# Patient Record
Sex: Male | Born: 1955 | Race: Black or African American | Hispanic: No | Marital: Single | Smoking: Current every day smoker
Health system: Southern US, Community
[De-identification: ages and names within clinical notes are randomized; demographics above are authoritative.]

## PROBLEM LIST (undated history)

## (undated) HISTORY — PX: SURGERY SCROTAL / TESTICULAR: SUR1316

---

## 2005-09-05 ENCOUNTER — Emergency Department: Payer: Self-pay | Admitting: Emergency Medicine

## 2008-08-20 ENCOUNTER — Emergency Department: Payer: Self-pay | Admitting: Emergency Medicine

## 2011-10-16 ENCOUNTER — Emergency Department: Payer: Self-pay | Admitting: Emergency Medicine

## 2011-10-16 LAB — CBC
HCT: 42.6 % (ref 40.0–52.0)
HGB: 14.1 g/dL (ref 13.0–18.0)
MCH: 28.5 pg (ref 26.0–34.0)
MCHC: 33 g/dL (ref 32.0–36.0)
RBC: 4.93 10*6/uL (ref 4.40–5.90)
WBC: 13.6 10*3/uL — ABNORMAL HIGH (ref 3.8–10.6)

## 2011-10-16 LAB — COMPREHENSIVE METABOLIC PANEL
Anion Gap: 12 (ref 7–16)
Bilirubin,Total: 0.7 mg/dL (ref 0.2–1.0)
Chloride: 96 mmol/L — ABNORMAL LOW (ref 98–107)
Co2: 30 mmol/L (ref 21–32)
Creatinine: 1.4 mg/dL — ABNORMAL HIGH (ref 0.60–1.30)
EGFR (African American): >60
EGFR (Non-African Amer.): 56 — ABNORMAL LOW
Glucose: 116 mg/dL — ABNORMAL HIGH (ref 65–99)
Osmolality: 278 (ref 275–301)
SGPT (ALT): 17 U/L
Sodium: 138 mmol/L (ref 136–145)
Total Protein: 8.6 g/dL — ABNORMAL HIGH (ref 6.4–8.2)

## 2011-10-16 LAB — RAPID INFLUENZA A&B ANTIGENS

## 2013-03-17 ENCOUNTER — Emergency Department: Payer: Self-pay | Admitting: Emergency Medicine

## 2013-12-19 ENCOUNTER — Emergency Department: Payer: Self-pay | Admitting: Emergency Medicine

## 2016-09-18 ENCOUNTER — Encounter: Payer: Self-pay | Admitting: Emergency Medicine

## 2016-09-18 ENCOUNTER — Emergency Department
Admission: EM | Admit: 2016-09-18 | Discharge: 2016-09-18 | Disposition: A | Discharge status: Home / Self Care | Payer: BLUE CROSS/BLUE SHIELD | Attending: Emergency Medicine | Admitting: Emergency Medicine

## 2016-09-18 ENCOUNTER — Emergency Department: Payer: BLUE CROSS/BLUE SHIELD

## 2016-09-18 DIAGNOSIS — F17210 Nicotine dependence, cigarettes, uncomplicated: Secondary | ICD-10-CM | POA: Diagnosis not present

## 2016-09-18 DIAGNOSIS — J189 Pneumonia, unspecified organism: Secondary | ICD-10-CM | POA: Insufficient documentation

## 2016-09-18 DIAGNOSIS — R05 Cough: Secondary | ICD-10-CM | POA: Diagnosis present

## 2016-09-18 MED ORDER — PREDNISONE 50 MG PO TABS: 50.0000 mg | ORAL_TABLET | Freq: Every day | ORAL | 0 refills | Status: AC

## 2016-09-18 MED ORDER — ALBUTEROL SULFATE HFA 108 (90 BASE) MCG/ACT IN AERS: 2.0000 | INHALATION_SPRAY | RESPIRATORY_TRACT | 0 refills | Status: AC | PRN

## 2016-09-18 MED ORDER — METHYLPREDNISOLONE SODIUM SUCC 125 MG IJ SOLR
125.0000 mg | Freq: Once | INTRAMUSCULAR | Status: AC
  Administered 2016-09-18: 125 mg via INTRAMUSCULAR
  Filled 2016-09-18: qty 2

## 2016-09-18 MED ORDER — AZITHROMYCIN 250 MG PO TABS: ORAL_TABLET | ORAL | 0 refills | Status: AC

## 2016-09-18 MED ORDER — PSEUDOEPH-BROMPHEN-DM 30-2-10 MG/5ML PO SYRP: 10.0000 mL | ORAL_SOLUTION | Freq: Four times a day (QID) | ORAL | 0 refills | Status: AC | PRN

## 2016-09-18 MED ORDER — ALBUTEROL SULFATE (2.5 MG/3ML) 0.083% IN NEBU
2.5000 mg | INHALATION_SOLUTION | Freq: Once | RESPIRATORY_TRACT | Status: AC
  Administered 2016-09-18: 2.5 mg via RESPIRATORY_TRACT
  Filled 2016-09-18: qty 3

## 2016-09-18 NOTE — ED Triage Notes (Signed)
Patient ambulatory to triage with steady gait, without difficulty or distress noted, mask in place; pt st prod cough green sputum x 2wks ago with fever and congestion; taking OTC meds without relief

## 2016-09-18 NOTE — ED Provider Notes (Signed)
[facility] Emergency Department Provider Note  ____________________________________________  Time seen: Approximately 10:18 PM  I have reviewed the triage vital signs and the nursing notes.   HISTORY  Chief Complaint Cough    HPI Kevin Villanueva is a 60 y.o. male who presents emergency department complaining of two-week history of coughing. 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 He reports that with the changing whether he got a "cold". Patient reports that initially had some nasal congestion, ear fullness, scratchy throat. He then developed a cough. Patient reports that the low-grade fever, nasal congestion, sore throat have resolved but he remains with the cough. Patient states that it is intermittently productive but has coughing spells that leaves him short of breath. Patient denies any actual shortness of breath. He denies any chest pain, abdominal pain, nausea or vomiting. Patient is tried multiple over-the-counter cold and flu preparations with no relief.   History reviewed. No pertinent past medical history.  There are no active problems to display for this patient.   Past Surgical History:  Procedure Laterality Date  . SURGERY SCROTAL / TESTICULAR      Prior to Admission medications   Medication Sig Start Date End Date Taking? Authorizing Provider  albuterol (PROVENTIL HFA;VENTOLIN HFA) 108 (90 Base) MCG/ACT inhaler Inhale 2 puffs into the lungs every 4 (four) hours as needed for wheezing or shortness of breath. 09/18/16   Charline Bills Efrain Clauson, PA-C  azithromycin (ZITHROMAX Z-PAK) 250 MG tablet Take 2 tablets (500 mg) on  Day 1,  followed by 1 tablet (250 mg) once daily on Days 2 through 5. 09/18/16   Roderic Palau D Cathi Hazan, PA-C  brompheniramine-pseudoephedrine-DM 30-2-10 MG/5ML syrup Take 10 mLs by mouth 4 (four) times daily as needed. 09/18/16   Charline Bills Cashlynn Yearwood, PA-C  predniSONE (DELTASONE) 50 MG tablet Take 1 tablet (50 mg  total) by mouth daily with breakfast. 09/18/16   Charline Bills May Manrique, PA-C    Allergies Patient has no known allergies.  No family history on file.  Social History Social History  Substance Use Topics  . Smoking status: Current Every Day Smoker    Packs/day: 1.00    Types: Cigarettes  . Smokeless tobacco: Never Used  . Alcohol use Yes     Comment: "occasional"     Review of Systems  Constitutional: No fever/chills Eyes: No visual changes.  ENT: Positive for resolved nasal congestion and sore throat. Cardiovascular: no chest pain. Respiratory: Positive for mainly dry but intermittently productive cough. No SOB. Gastrointestinal: No abdominal pain.  No nausea, no vomiting.  No diarrhea.  No constipation. Musculoskeletal: Negative for musculoskeletal pain. Skin: Negative for rash, abrasions, lacerations, ecchymosis. Neurological: Negative for headaches, focal weakness or numbness. 10-point ROS otherwise negative.  ____________________________________________   PHYSICAL EXAM:  VITAL SIGNS: ED Triage Vitals  Enc Vitals Group     BP 09/18/16 2003 (!) 159/85     Pulse Rate 09/18/16 2003 68     Resp 09/18/16 2003 20     Temp 09/18/16 2003 98.6 F (37 C)     Temp Source 09/18/16 2003 Oral     SpO2 09/18/16 2003 97 %     Weight 09/18/16 2001 153 lb (69.4 kg)     Height 09/18/16 2001 5\' 7"  (1.702 m)     Head Circumference --      Peak Flow --      Pain Score --      Pain Loc --  Pain Edu? --      Excl. in [place]? --      Constitutional: Alert and oriented. Well appearing and in no acute distress. Eyes: Conjunctivae are normal. PERRL. EOMI. Head: Atraumatic. ENT:      Ears: EACs and TMs are unremarkable bilaterally.      Nose: No congestion/rhinnorhea.      Mouth/Throat: Mucous membranes are moist. Her pharynx is nonerythematous and nonedematous. Neck: No stridor.   Hematological/Lymphatic/Immunilogical: No cervical lymphadenopathy. Cardiovascular: Normal rate,  regular rhythm. Normal S1 and S2.  Good peripheral circulation. Respiratory: Normal respiratory effort without tachypnea or retractions. Lungs with scattered expiratory wheezing bilateral lung fields. No rales or rhonchi.Kermit Balo air entry to the bases with no decreased or absent breath sounds. Musculoskeletal: Full range of motion to all extremities. No gross deformities appreciated. Neurologic:  Normal speech and language. No gross focal neurologic deficits are appreciated.  Skin:  Skin is warm, dry and intact. No rash noted. Psychiatric: Mood and affect are normal. Speech and behavior are normal. Patient exhibits appropriate insight and judgement.   ____________________________________________   LABS (all labs ordered are listed, but only abnormal results are displayed)  Labs Reviewed - No data to display ____________________________________________  EKG   ____________________________________________  RADIOLOGY Diamantina Providence Starling Jessie, personally viewed and evaluated these images (plain radiographs) as part of my medical decision making, as well as reviewing the written report by the radiologist.  Dg Chest 2 View  Result Date: 09/18/2016 CLINICAL DATA:  Productive cough 2 weeks ago with fever. No relief from over the counter medications. EXAM: CHEST  2 VIEW COMPARISON:  None. FINDINGS: The heart size and mediastinal contours are within normal limits. Both lungs are clear. The visualized skeletal structures are unremarkable. IMPRESSION: No active cardiopulmonary disease. Electronically Signed   By: Andreas Newport M.D.   On: 09/18/2016 21:39    ____________________________________________    PROCEDURES  Procedure(s) performed:    Procedures    Medications  albuterol (PROVENTIL) (2.5 MG/3ML) 0.083% nebulizer solution 2.5 mg (2.5 mg Nebulization Given 09/18/16 2236)  methylPREDNISolone sodium succinate (SOLU-MEDROL) 125 mg/2 mL injection 125 mg (125 mg Intramuscular Given  09/18/16 2233)     ____________________________________________   INITIAL IMPRESSION / ASSESSMENT AND PLAN / ED COURSE  Pertinent labs & imaging results that were available during my care of the patient were reviewed by me and considered in my medical decision making (see chart for details).  Review of the [place] CSRS was performed in accordance of the [place] prior to dispensing any controlled drugs.  Clinical Course     Patient's diagnosis is consistent with Mild community-acquired pneumonia. Patient has had ongoing coughing symptoms have been increasing over the past 2 weeks. Patient's symptoms likely began with a viral upper respiratory illness that it progressed to a bacterial infection. X-ray is reassuring with no acute consolidation. Exam does reveal scattered wheezing for which patient is treated with albuterol and injection of steroids to the emergency department. After albuterol, patient's exam has improved.. Patient will be discharged home with prescriptions for antibiotics, cough syrup, prednisone, albuterol. Patient is to follow up with primary care as needed or otherwise directed. Patient is given ED precautions to return to the ED for any worsening or new symptoms.     ____________________________________________  FINAL CLINICAL IMPRESSION(S) / ED DIAGNOSES  Final diagnoses:  Community acquired pneumonia, unspecified laterality      NEW MEDICATIONS STARTED DURING THIS VISIT:  New Prescriptions   ALBUTEROL (PROVENTIL HFA;VENTOLIN HFA) 108 (  90 BASE) MCG/ACT INHALER    Inhale 2 puffs into the lungs every 4 (four) hours as needed for wheezing or shortness of breath.   AZITHROMYCIN (ZITHROMAX Z-PAK) 250 MG TABLET    Take 2 tablets (500 mg) on  Day 1,  followed by 1 tablet (250 mg) once daily on Days 2 through 5.   BROMPHENIRAMINE-PSEUDOEPHEDRINE-DM 30-2-10 MG/5ML SYRUP    Take 10 mLs by mouth 4 (four) times daily as needed.   PREDNISONE (DELTASONE) 50 MG TABLET    Take 1  tablet (50 mg total) by mouth daily with breakfast.        This chart was dictated using voice recognition software/Dragon. Despite best efforts to proofread, errors can occur which can change the meaning. Any change was purely unintentional.    Darletta Moll, PA-C 09/18/16 2252    Carrie Mew, MD 09/19/16 2243

## 2016-09-27 ENCOUNTER — Encounter: Payer: Self-pay | Admitting: Emergency Medicine

## 2016-09-27 ENCOUNTER — Emergency Department
Admission: EM | Admit: 2016-09-27 | Discharge: 2016-09-27 | Disposition: A | Discharge status: Home / Self Care | Payer: BLUE CROSS/BLUE SHIELD | Attending: Emergency Medicine | Admitting: Emergency Medicine

## 2016-09-27 DIAGNOSIS — Z79899 Other long term (current) drug therapy: Secondary | ICD-10-CM | POA: Diagnosis not present

## 2016-09-27 DIAGNOSIS — R059 Cough, unspecified: Secondary | ICD-10-CM

## 2016-09-27 DIAGNOSIS — R05 Cough: Secondary | ICD-10-CM | POA: Diagnosis present

## 2016-09-27 DIAGNOSIS — F17210 Nicotine dependence, cigarettes, uncomplicated: Secondary | ICD-10-CM | POA: Insufficient documentation

## 2016-09-27 NOTE — ED Triage Notes (Signed)
Pt to ed with c/o cough, congestion.  States was seen here for same on 12/18.  States he took all meds and is ready to go back to work but wants to be seen before going back.

## 2016-09-27 NOTE — ED Notes (Signed)
Pt states was seen here and given prescriptions, states coughing still going on. Feels like medications have been working. States feels weak, pt talking very fast. Still has some of medication left to take. Pt states inhaler is working for his wheezing. Still has cough. Pt talking about random things, asking questions. Pt alert and oriented, speaking in complete sentences. Ambulatory to room.    Pt tried to go to [facility] but was unable to, wants to be cleared to go back to work "since they told me to come back."

## 2016-09-27 NOTE — ED Provider Notes (Signed)
[facility] Emergency Department Provider Note  ____________________________________________  Time seen: Approximately 6:36 PM  I have reviewed the triage vital signs and the nursing notes.   HISTORY  Chief Complaint Cough    HPI Kevin Villanueva is a 60 y.o. male presenting to the emergency department for reassurance. Patient was seen on 09/18/2016 and was diagnosed with community-acquired pneumonia. Patient was discharged with azithromycin. Patient states that he has taken azithromycin as prescribed. Patient states that non-productive cough has improved and his energy is slowly returning. Patient states that he has occasional chills at night. He has been a unable to assess for fever as he doesn't have a thermometer. Patient states that he feels like he needs a few more days off from work. He was going to follow up with his primary care provider. However, he cannot locate his driver's license. No wheezing or shortness of breath. No chest tightness or chest pain.   History reviewed. No pertinent past medical history.  There are no active problems to display for this patient.   Past Surgical History:  Procedure Laterality Date  . SURGERY SCROTAL / TESTICULAR      Prior to Admission medications   Medication Sig Start Date End Date Taking? Authorizing Provider  albuterol (PROVENTIL HFA;VENTOLIN HFA) 108 (90 Base) MCG/ACT inhaler Inhale 2 puffs into the lungs every 4 (four) hours as needed for wheezing or shortness of breath. 09/18/16   Charline Bills Cuthriell, PA-C  azithromycin (ZITHROMAX Z-PAK) 250 MG tablet Take 2 tablets (500 mg) on  Day 1,  followed by 1 tablet (250 mg) once daily on Days 2 through 5. 09/18/16   Roderic Palau D Cuthriell, PA-C  brompheniramine-pseudoephedrine-DM 30-2-10 MG/5ML syrup Take 10 mLs by mouth 4 (four) times daily as needed. 09/18/16   Charline Bills Cuthriell, PA-C  predniSONE (DELTASONE) 50 MG tablet Take 1 tablet (50 mg total) by mouth daily  with breakfast. 09/18/16   Charline Bills Cuthriell, PA-C    Allergies Patient has no known allergies.  No family history on file.  Social History Social History  Substance Use Topics  . Smoking status: Current Every Day Smoker    Packs/day: 1.00    Types: Cigarettes  . Smokeless tobacco: Never Used  . Alcohol use Yes     Comment: "occasional"     Review of Systems  Constitutional: Improving energy. Eyes: No visual changes. No discharge ENT: No upper respiratory complaints. Cardiovascular: no chest pain. Respiratory: Improving cough.  No SOB. Gastrointestinal: No abdominal pain.  No nausea, no vomiting.  No diarrhea.  No constipation. Musculoskeletal: Negative for musculoskeletal pain. Skin: Negative for rash, abrasions, lacerations, ecchymosis. Neurological: Negative for headaches, focal weakness or numbness. 10-point ROS otherwise negative.  ____________________________________________   PHYSICAL EXAM:  VITAL SIGNS: ED Triage Vitals  Enc Vitals Group     BP 09/27/16 1754 (!) 145/78     Pulse Rate 09/27/16 1754 85     Resp 09/27/16 1754 20     Temp 09/27/16 1754 (!) 89.2 F (31.8 C)     Temp Source 09/27/16 1754 Oral     SpO2 09/27/16 1754 100 %     Weight 09/27/16 1755 153 lb (69.4 kg)     Height --      Head Circumference --      Peak Flow --      Pain Score 09/27/16 1755 0     Pain Loc --      Pain Edu? --  Excl. in [place]? --      Constitutional: Alert and oriented. Well appearing and in no acute distress. Eyes: Conjunctivae are normal. PERRL. EOMI. Head: Atraumatic. ENT:      Ears: Tympanic membranes are visualized bilaterally without erythema or purulent exudate.      Nose: No congestion/rhinnorhea.      Mouth/Throat: Mucous membranes are moist.  Neck: FROM.  Hematological/Lymphatic/Immunilogical: No cervical lymphadenopathy. Cardiovascular: Normal rate, regular rhythm. Normal S1 and S2.  Good peripheral circulation. Respiratory: Normal  respiratory effort without tachypnea or retractions. Lungs CTAB. Good air entry to the bases with no decreased or absent breath sounds. Neurologic:  Normal speech and language. No gross focal neurologic deficits are appreciated.  Skin:  Skin is warm, dry and intact. No rash noted. Psychiatric: Mood and affect are normal. Speech and behavior are normal. Patient exhibits appropriate insight and judgement.  ____________________________________________   LABS (all labs ordered are listed, but only abnormal results are displayed)  Labs Reviewed - No data to display ____________________________________________  EKG   ____________________________________________  RADIOLOGY  No results found.  ____________________________________________    PROCEDURES  Procedure(s) performed:    Procedures    Medications - No data to display   ____________________________________________   INITIAL IMPRESSION / ASSESSMENT AND PLAN / ED COURSE  Pertinent labs & imaging results that were available during my care of the patient were reviewed by me and considered in my medical decision making (see chart for details).  Review of the [place] CSRS was performed in accordance of the [place] prior to dispensing any controlled drugs.  Clinical Course     Assessment and Plan: Patient presents to the emergency department for reassurance. He was seen 09/18/2016 and diagnosed with community-acquired pneumonia. Patient states that he has adhered to pharmacologic regimen. Patient claims improving energy and nonproductive cough. I do not feel that an additional workup is warranted at this time. Patient requests to be written out of work for 3 more days. Vital signs are reassuring at this time. All patient questions were answered.  ____________________________________________  FINAL CLINICAL IMPRESSION(S) / ED DIAGNOSES  Final diagnoses:  Cough      NEW MEDICATIONS STARTED DURING THIS VISIT:  Discharge  Medication List as of 09/27/2016  6:30 PM          This chart was dictated using voice recognition software/Dragon. Despite best efforts to proofread, errors can occur which can change the meaning. Any change was purely unintentional.    Lannie Fields, PA-C 09/27/16 1946    Hinda Kehr, MD 09/28/16 0000

## 2020-09-27 ENCOUNTER — Emergency Department: Payer: Self-pay

## 2020-09-27 ENCOUNTER — Other Ambulatory Visit: Payer: Self-pay

## 2020-09-27 ENCOUNTER — Emergency Department
Admission: EM | Admit: 2020-09-27 | Discharge: 2020-09-27 | Disposition: A | Discharge status: Home / Self Care | Payer: Self-pay | Attending: Emergency Medicine | Admitting: Emergency Medicine

## 2020-09-27 DIAGNOSIS — R1032 Left lower quadrant pain: Secondary | ICD-10-CM

## 2020-09-27 DIAGNOSIS — F17210 Nicotine dependence, cigarettes, uncomplicated: Secondary | ICD-10-CM | POA: Insufficient documentation

## 2020-09-27 DIAGNOSIS — R1030 Lower abdominal pain, unspecified: Secondary | ICD-10-CM

## 2020-09-27 DIAGNOSIS — R112 Nausea with vomiting, unspecified: Secondary | ICD-10-CM | POA: Insufficient documentation

## 2020-09-27 DIAGNOSIS — K4090 Unilateral inguinal hernia, without obstruction or gangrene, not specified as recurrent: Secondary | ICD-10-CM | POA: Insufficient documentation

## 2020-09-27 LAB — URINALYSIS, COMPLETE (UACMP) WITH MICROSCOPIC
Bacteria, UA: NONE SEEN
Bilirubin Urine: NEGATIVE
Glucose, UA: NEGATIVE mg/dL
Hgb urine dipstick: NEGATIVE
Ketones, ur: NEGATIVE mg/dL
Nitrite: NEGATIVE
Protein, ur: NEGATIVE mg/dL
Specific Gravity, Urine: 1.021 (ref 1.005–1.030)
pH: 5 (ref 5.0–8.0)

## 2020-09-27 LAB — CBC
HCT: 46 % (ref 39.0–52.0)
Hemoglobin: 14.9 g/dL (ref 13.0–17.0)
MCH: 29.2 pg (ref 26.0–34.0)
MCHC: 32.4 g/dL (ref 30.0–36.0)
MCV: 90 fL (ref 80.0–100.0)
Platelets: 196 10*3/uL (ref 150–400)
RBC: 5.11 MIL/uL (ref 4.22–5.81)
RDW: 13.2 % (ref 11.5–15.5)
WBC: 6.4 10*3/uL (ref 4.0–10.5)
nRBC: 0 % (ref 0.0–0.2)

## 2020-09-27 LAB — BASIC METABOLIC PANEL
Anion gap: 8 (ref 5–15)
BUN: 19 mg/dL (ref 8–23)
CO2: 25 mmol/L (ref 22–32)
Calcium: 9.6 mg/dL (ref 8.9–10.3)
Chloride: 109 mmol/L (ref 98–111)
Creatinine, Ser: 1.03 mg/dL (ref 0.61–1.24)
GFR, Estimated: >60 mL/min (ref >60)
Glucose, Bld: 134 mg/dL — ABNORMAL HIGH (ref 70–99)
Potassium: 3.6 mmol/L (ref 3.5–5.1)
Sodium: 142 mmol/L (ref 135–145)

## 2020-09-27 MED ORDER — KETOROLAC TROMETHAMINE 30 MG/ML IJ SOLN
15.0000 mg | Freq: Once | INTRAMUSCULAR | Status: AC
  Administered 2020-09-27: 23:00:00 15 mg via INTRAVENOUS
  Filled 2020-09-27: qty 1

## 2020-09-27 MED ORDER — IOHEXOL 300 MG/ML  SOLN
100.0000 mL | Freq: Once | INTRAMUSCULAR | Status: AC | PRN
  Administered 2020-09-27: 23:00:00 100 mL via INTRAVENOUS

## 2020-09-27 NOTE — ED Provider Notes (Signed)
[facility] Emergency Department Provider Note ____________________________________________   Event Date/Time   First MD Initiated Contact with Patient 09/27/20 2045     (approximate)  I have reviewed the triage vital signs and the nursing notes.  HISTORY  Chief Complaint Groin Pain   HPI Kevin Villanueva is a 64 y.o. malewho presents to the ED for evaluation of left inguinal pain.   Chart review indicates no other medical history.  Patient presents to the ED with 3 days of intermittent left inguinal/groin pain.  He reports a remote history of testicular torsion.   Patient reports associated nausea without vomiting, denies stool changes, dysuria, hematuria, flank pain, emesis.  Currently reporting 3/10 intensity aching pain in his left groin that intermittently radiates down his left anterior leg.  Denies trauma or skin changes.   History reviewed. No pertinent past medical history.  There are no problems to display for this patient.   Past Surgical History:  Procedure Laterality Date  . SURGERY SCROTAL / TESTICULAR      Prior to Admission medications   Medication Sig Start Date End Date Taking? Authorizing Provider  albuterol (PROVENTIL HFA;VENTOLIN HFA) 108 (90 Base) MCG/ACT inhaler Inhale 2 puffs into the lungs every 4 (four) hours as needed for wheezing or shortness of breath. 09/18/16   Cuthriell, Delorise Royals, PA-C  azithromycin (ZITHROMAX Z-PAK) 250 MG tablet Take 2 tablets (500 mg) on  Day 1,  followed by 1 tablet (250 mg) once daily on Days 2 through 5. 09/18/16   Cuthriell, Delorise Royals, PA-C  brompheniramine-pseudoephedrine-DM 30-2-10 MG/5ML syrup Take 10 mLs by mouth 4 (four) times daily as needed. 09/18/16   Cuthriell, Delorise Royals, PA-C  predniSONE (DELTASONE) 50 MG tablet Take 1 tablet (50 mg total) by mouth daily with breakfast. 09/18/16   Cuthriell, Delorise Royals, PA-C    Allergies Patient has no known allergies.  No family history on  file.  Social History Social History   Tobacco Use  . Smoking status: Current Every Day Smoker    Packs/day: 1.00    Types: Cigarettes  . Smokeless tobacco: Never Used  Substance Use Topics  . Alcohol use: Yes    Comment: "occasional"    Review of Systems  Constitutional: No fever/chills Eyes: No visual changes. ENT: No sore throat. Cardiovascular: Denies chest pain. Respiratory: Denies shortness of breath. Gastrointestinal, no vomiting.  No diarrhea.  No constipation. Positive for left inguinal pain and nausea. Genitourinary: Negative for dysuria. Musculoskeletal: Negative for back pain. Skin: Negative for rash. Neurological: Negative for headaches, focal weakness or numbness.  ____________________________________________   PHYSICAL EXAM:  VITAL SIGNS: Vitals:   09/27/20 1743 09/27/20 2133  BP:  (!) 154/87  Pulse:  66  Resp:  16  Temp: 98.7 F (37.1 C) 98.5 F (36.9 C)  SpO2:  98%     Constitutional: Alert and oriented. Well appearing and in no acute distress. Eyes: Conjunctivae are normal. PERRL. EOMI. Head: Atraumatic. Nose: No congestion/rhinnorhea. Mouth/Throat: Mucous membranes are moist.  Oropharynx non-erythematous. Neck: No stridor. No cervical spine tenderness to palpation. Cardiovascular: Normal rate, regular rhythm. Grossly normal heart sounds.  Good peripheral circulation. Respiratory: Normal respiratory effort.  No retractions. Lungs CTAB. Gastrointestinal: Soft , nondistended, nontender to palpation. No CVA tenderness.  Benign abdomen. Left inguinal vague tenderness to palpation that is mild.  No palpable masses, or lymphadenopathy appreciated. Normal-appearing external genitalia without swelling or signs of trauma.  Nontender testicles. Musculoskeletal: No lower extremity tenderness nor edema.  No joint  effusions. No signs of acute trauma. Neurologic:  Normal speech and language. No gross focal neurologic deficits are appreciated. No gait  instability noted. Skin:  Skin is warm, dry and intact. No rash noted. Psychiatric: Mood and affect are normal. Speech and behavior are normal.  ____________________________________________   LABS (all labs ordered are listed, but only abnormal results are displayed)  Labs Reviewed  BASIC METABOLIC PANEL - Abnormal; Notable for the following components:      Result Value   Glucose, Bld 134 (*)    All other components within normal limits  URINALYSIS, COMPLETE (UACMP) WITH MICROSCOPIC - Abnormal; Notable for the following components:   Color, Urine YELLOW (*)    APPearance HAZY (*)    Leukocytes,Ua MODERATE (*)    All other components within normal limits  CBC   ____________________________________________  RADIOLOGY  ED MD interpretation: CT abdomen/pelvis reviewed by me without evidence of acute intra-abdominal pathology.  Official radiology report(s): CT ABDOMEN PELVIS W CONTRAST  Result Date: 09/27/2020 CLINICAL DATA:  Left lower quadrant abdominal pain EXAM: CT ABDOMEN AND PELVIS WITH CONTRAST TECHNIQUE: Multidetector CT imaging of the abdomen and pelvis was performed using the standard protocol following bolus administration of intravenous contrast. CONTRAST:  148mL OMNIPAQUE IOHEXOL 300 MG/ML  SOLN COMPARISON:  None. FINDINGS: Lower chest: The visualized heart size within normal limits. No pericardial fluid/thickening. No hiatal hernia. The visualized portions of the lungs are clear. Hepatobiliary: The liver is normal in density without focal abnormality.The main portal vein is patent. No evidence of calcified gallstones, gallbladder wall thickening or biliary dilatation. Pancreas: Unremarkable. No pancreatic ductal dilatation or surrounding inflammatory changes. Spleen: Normal in size without focal abnormality. Adrenals/Urinary Tract: Both adrenal glands appear normal. The kidneys and collecting system appear normal without evidence of urinary tract calculus or hydronephrosis.  Bladder is unremarkable. Stomach/Bowel: The stomach and small bowel are normal in appearance. Scattered colonic diverticula are noted without diverticulitis Vascular/Lymphatic: There are no enlarged mesenteric, retroperitoneal, or pelvic lymph nodes. No significant vascular findings are present. Reproductive: The prostate is unremarkable. Other: No evidence of abdominal wall mass or hernia. Musculoskeletal: No acute or significant osseous findings. IMPRESSION: Colonic diverticulosis without evidence of colonic diverticulitis No left inguinal hernia as seen on the prior ultrasound. Electronically Signed   By: Prudencio Pair M.D.   On: 09/27/2020 22:50   US SCROTUM W/DOPPLER  Result Date: 09/27/2020 CLINICAL DATA:  LEFT-sided scrotal/groin pain. History of testicular torsion with surgical repair. EXAM: SCROTAL ULTRASOUND DOPPLER ULTRASOUND OF THE TESTICLES TECHNIQUE: Complete ultrasound examination of the testicles, epididymis, and other scrotal structures was performed. Color and spectral Doppler ultrasound were also utilized to evaluate blood flow to the testicles. COMPARISON:  None. FINDINGS: Right testicle Measurements: 3.8 x 2.1 x 3 cm. No mass or microlithiasis visualized. Left testicle Measurements: 2.1 x 0.8 x 1.8 cm. No mass visualized. Scattered parenchymal calcifications. Right epididymis:  Normal in size and appearance. Left epididymis: Essentially normal in size and appearance. Small epididymal cyst. Hydrocele:  None visualized. Varicocele:  None visualized. Pulsed Doppler interrogation of both testes demonstrates normal low resistance arterial and venous waveforms bilaterally. At the site of pain, LEFT groin region, hyperechoic masslike structure is seen within the LEFT inguinal canal measuring 3.3 x 0.8 cm, avascular. IMPRESSION: 1. Hyperechoic masslike structure within the LEFT inguinal canal, measuring 3.3 x 0.8 cm, avascular, corresponding to the site of pain as directed by the patient. This is  suspected to represent herniation of mesenteric fat into the LEFT inguinal  canal. No peristalsing bowel loops are appreciated in the inguinal canal. Consider CT abdomen and pelvis to confirm. 2. No evidence of testicular torsion or orchitis. 3. Asymmetrically diminutive LEFT testicle, presumably related to the given history of previous torsion and surgical repair. 4. No evidence of epididymitis. 5. No hydrocele. Electronically Signed   By: Franki Cabot M.D.   On: 09/27/2020 18:52    ____________________________________________   PROCEDURES and INTERVENTIONS  Procedure(s) performed (including Critical Care):  .1-3 Lead EKG Interpretation Performed by: Vladimir Crofts, MD Authorized by: Vladimir Crofts, MD     Interpretation: normal     ECG rate:  68   ECG rate assessment: normal     Rhythm: sinus rhythm     Ectopy: none     Conduction: normal      Medications  ketorolac (TORADOL) 30 MG/ML injection 15 mg (has no administration in time range)  iohexol (OMNIPAQUE) 300 MG/ML solution 100 mL (100 mLs Intravenous Contrast Given 09/27/20 2232)    ____________________________________________   MDM / ED COURSE   64 year old male presents to the ED with intermittent left inguinal pain, likely due to intermittent inguinal hernia, and amenable to outpatient management with surgical follow-up.  Normal vitals on room air.  Exam some vague tenderness over his left inguinal canal without masses, fluctuance or overlying skin changes.  His vital abdomen is benign and his GU examination is similarly benign.  He otherwise looks well.  Blood work is unremarkable.  Urine with some leukocytes, though patient has no symptoms to suggest acute urinary tract infectious disease, this urine was sent for culture.  Testicular ultrasound obtained due to his history of torsion, and does not show evidence of testicular pathology.  Does partially visualized a left-sided inguinal hernia.  Follow-up CT imaging obtained and  does not show any further evidence of inguinal herniation or additional acute pathology.  I suspect he is intermittently herniating, I see no pathology to preclude outpatient management this time.  Outpatient surgical follow-up information provided and we discussed return precautions for the ED.  Patient medically stable for discharge home.    ____________________________________________   FINAL CLINICAL IMPRESSION(S) / ED DIAGNOSES  Final diagnoses:  Left inguinal pain     ED Discharge Orders    None       Daelynn Blower   Note:  This document was prepared using Dragon voice recognition software and may include unintentional dictation errors.    Vladimir Crofts, MD 09/27/20 [phone]

## 2020-09-27 NOTE — Discharge Instructions (Signed)
As we discussed, you likely have a small hernia that is going in and out of your left groin causing your intermittent discomfort.  Potential phone number for one of the local surgeons, please call his clinic to be seen in the clinic for the next couple weeks.  If you develop any severely worsening symptoms, please return to the ED.  Please take Tylenol and ibuprofen/Advil for your pain.  It is safe to take them together, or to alternate them every few hours.  Take up to 1000mg  of Tylenol at a time, up to 4 times per day.  Do not take more than 4000 mg of Tylenol in 24 hours.  For ibuprofen, take 400-600 mg, 4-5 times per day.

## 2020-09-27 NOTE — ED Triage Notes (Signed)
PT to ED via EMS from home. PT has L groin pain starting Friday, has not gotten better. Pain is now shooting down L leg.  HX of testicular  Torsion, no testicular pain or abd pain today.

## 2020-09-29 LAB — URINE CULTURE: Culture: >=100000 — AB

## 2021-06-07 ENCOUNTER — Other Ambulatory Visit (HOSPITAL_COMMUNITY): Payer: Self-pay | Admitting: Surgery

## 2021-06-07 ENCOUNTER — Other Ambulatory Visit: Payer: Self-pay | Admitting: Surgery

## 2021-06-07 DIAGNOSIS — D1724 Benign lipomatous neoplasm of skin and subcutaneous tissue of left leg: Secondary | ICD-10-CM

## 2021-12-30 IMAGING — US US SCROTUM W/ DOPPLER COMPLETE
1 series · 13 of 25 positions shown · non-contrast
Comparison: None.

CLINICAL DATA: LEFT-sided scrotal/groin pain. History of testicular
torsion with surgical repair.

EXAM:
SCROTAL ULTRASOUND
DOPPLER ULTRASOUND OF THE TESTICLES
TECHNIQUE: Complete ultrasound examination of the testicles, epididymis, and
other scrotal structures was performed. Color and spectral Doppler
ultrasound were also utilized to evaluate blood flow to the
testicles.

[Series 1: us scrotum w/doppler · 13 of 67 slices shown]
[im 1/67]
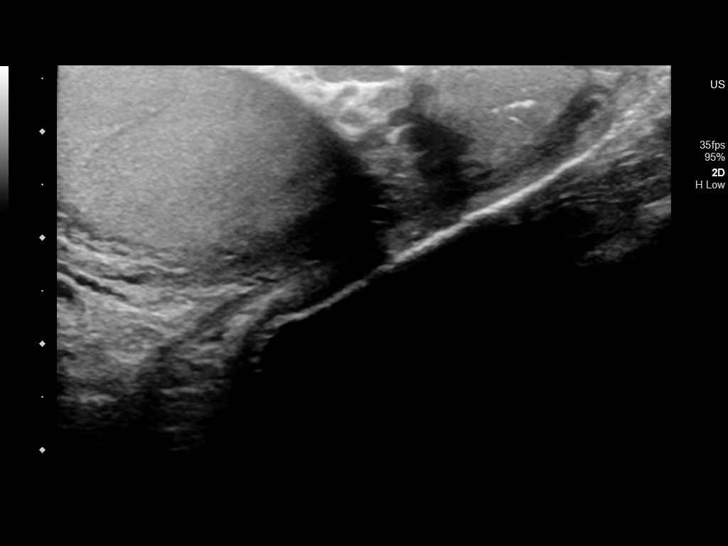
[im 6/67]
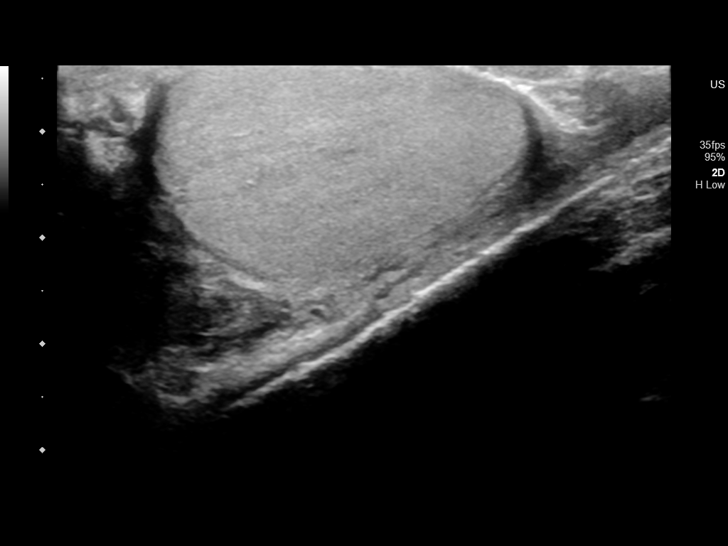
[im 12/67]
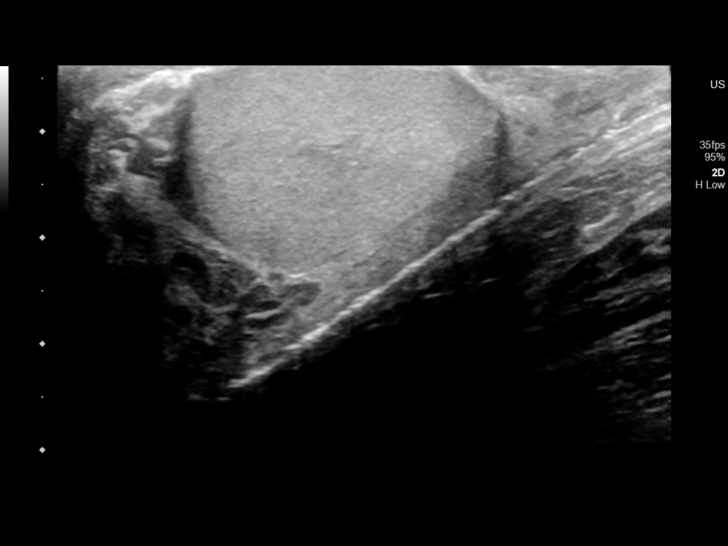
[im 17/67]
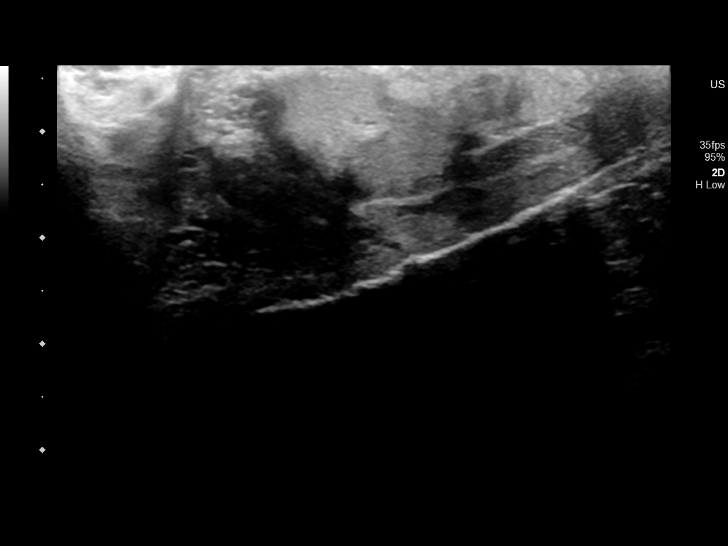
[im 23/67]
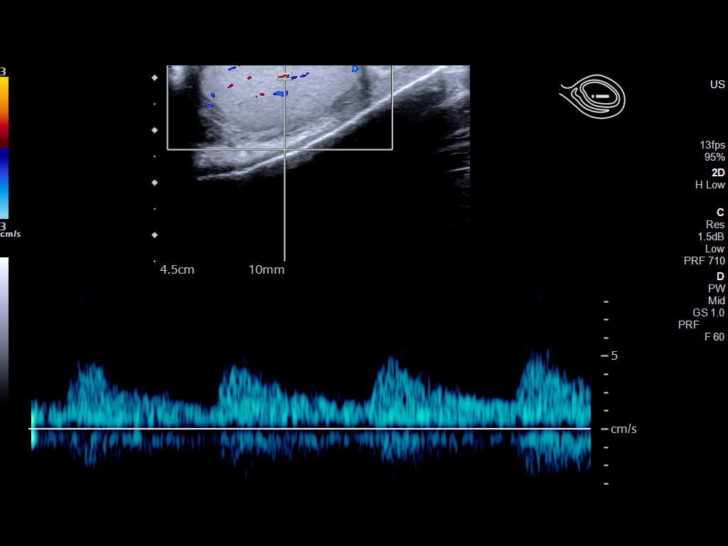
[im 28/67]
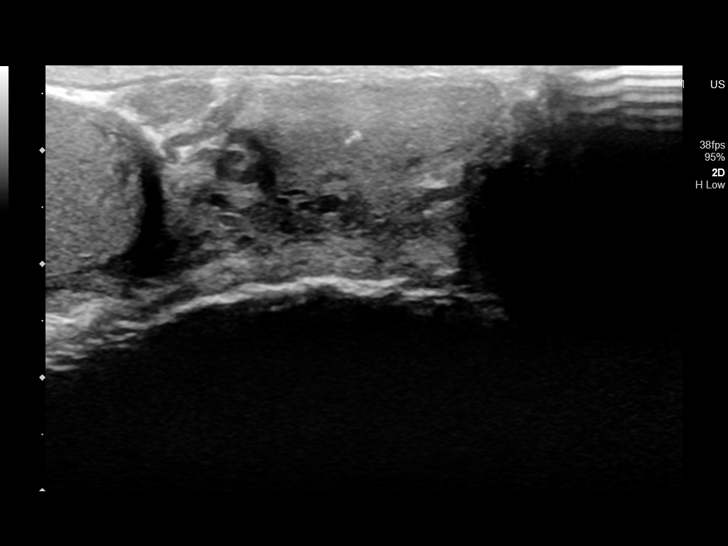
[im 34/67]
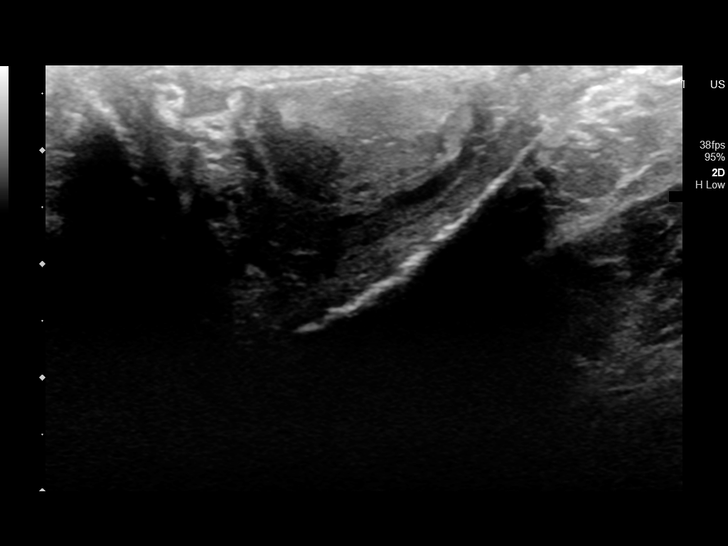
[im 39/67]
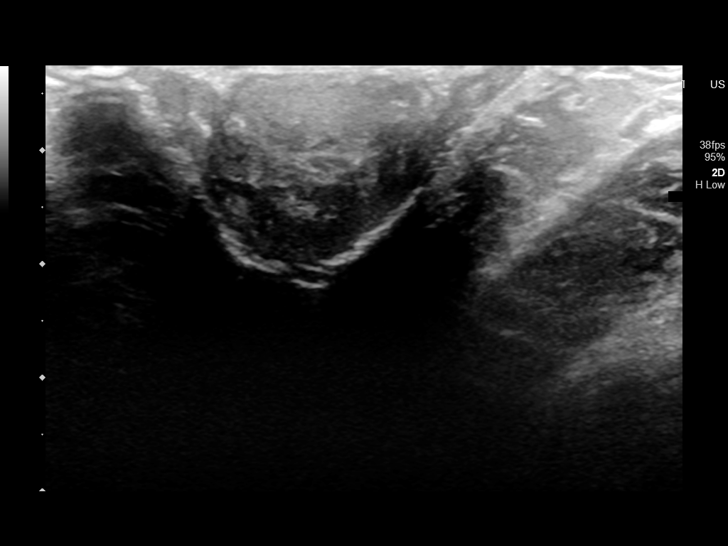
[im 45/67]
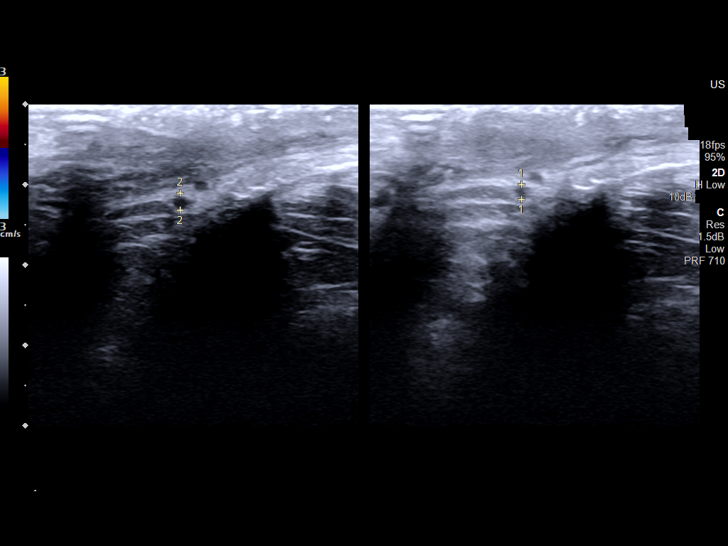
[im 50/67]
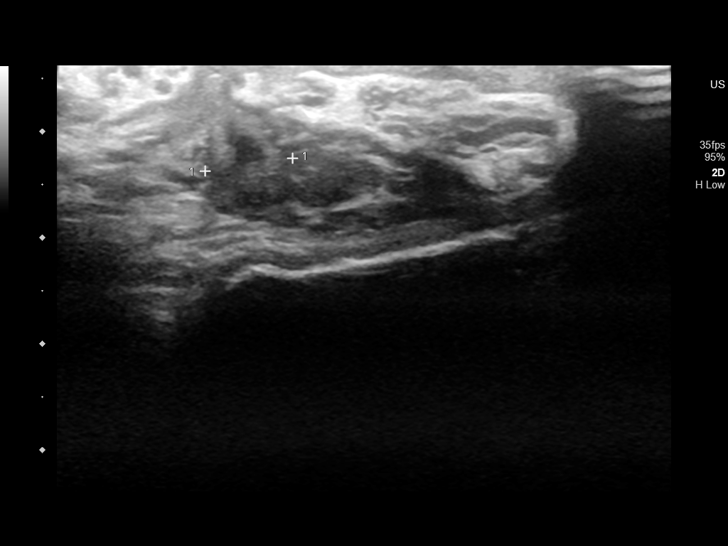
[im 56/67]
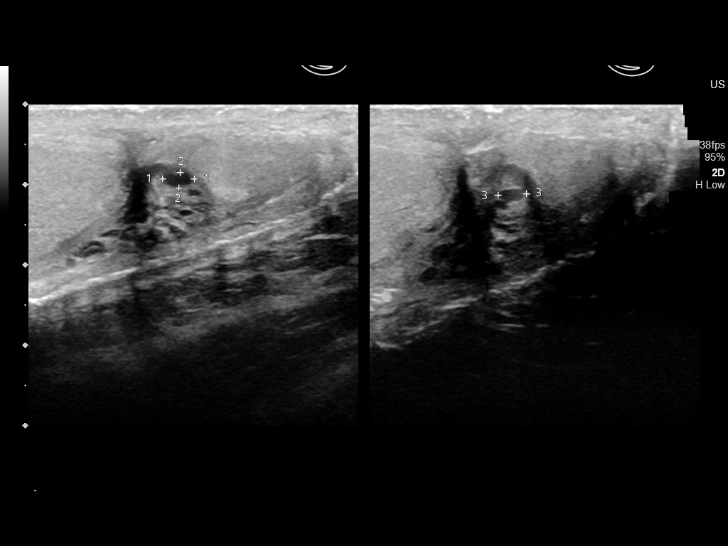
[im 61/67]
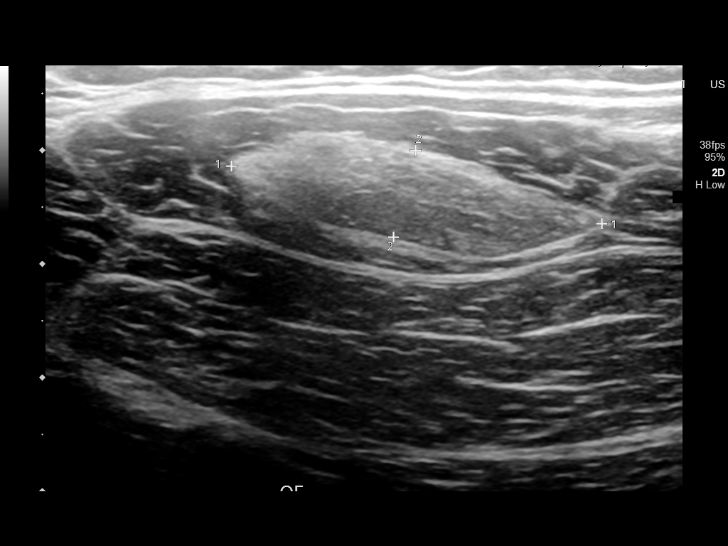
[im 67/67]
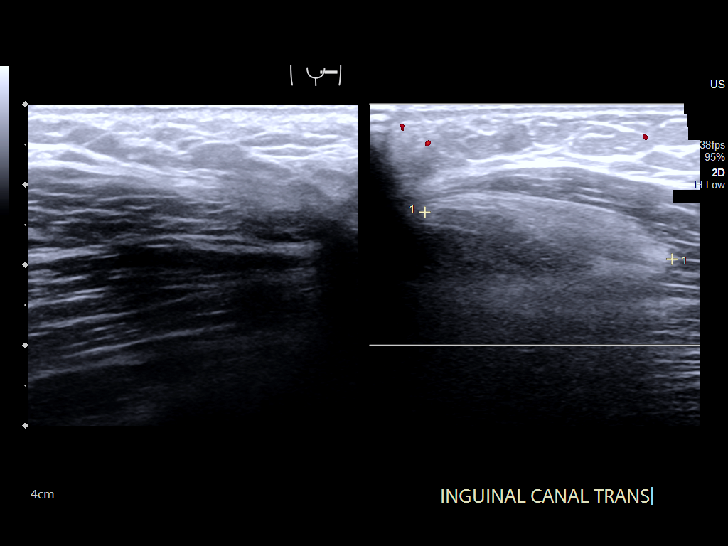

[13 of 25 positions shown; findings below may reference images not displayed]

FINDINGS: Right testicle

Measurements: 3.8 x 2.1 x 3 cm. No mass or microlithiasis
visualized.

Left testicle

Measurements: 2.1 x 0.8 x 1.8 cm. No mass visualized. Scattered
parenchymal calcifications.

Right epididymis:  Normal in size and appearance.

Left epididymis: Essentially normal in size and appearance. Small
epididymal cyst.

Hydrocele:  None visualized.

Varicocele:  None visualized.

Pulsed Doppler interrogation of both testes demonstrates normal low
resistance arterial and venous waveforms bilaterally.

At the site of pain, LEFT groin region, hyperechoic masslike
structure is seen within the LEFT inguinal canal measuring 3.3 x
cm, avascular.
IMPRESSION: 1. Hyperechoic masslike structure within the LEFT inguinal canal,
measuring 3.3 x 0.8 cm, avascular, corresponding to the site of pain
as directed by the patient. This is suspected to represent
herniation of mesenteric fat into the LEFT inguinal canal. No
peristalsing bowel loops are appreciated in the inguinal canal.
Consider CT abdomen and pelvis to confirm.
2. No evidence of testicular torsion or orchitis.
3. Asymmetrically diminutive LEFT testicle, presumably related to
the given history of previous torsion and surgical repair.
4. No evidence of epididymitis.
5. No hydrocele.
